# Patient Record
Sex: Female | Born: 1965 | Race: Asian | Hispanic: No | Marital: Married | Smoking: Never smoker
Health system: Southern US, Community
[De-identification: ages and names within clinical notes are randomized; demographics above are authoritative.]

## PROBLEM LIST (undated history)

## (undated) DIAGNOSIS — R011 Cardiac murmur, unspecified: Secondary | ICD-10-CM

## (undated) HISTORY — DX: Cardiac murmur, unspecified: R01.1

---

## 2006-06-02 HISTORY — PX: UMBILICAL HERNIA REPAIR: SHX196

## 2009-06-02 HISTORY — PX: ABDOMINAL HYSTERECTOMY: SHX81

## 2020-03-23 ENCOUNTER — Ambulatory Visit: Payer: Self-pay | Admitting: Family Medicine

## 2020-04-03 ENCOUNTER — Ambulatory Visit (INDEPENDENT_AMBULATORY_CARE_PROVIDER_SITE_OTHER): Payer: PRIVATE HEALTH INSURANCE | Admitting: Family Medicine

## 2020-04-03 ENCOUNTER — Other Ambulatory Visit: Payer: Self-pay

## 2020-04-03 ENCOUNTER — Encounter: Payer: Self-pay | Admitting: Family Medicine

## 2020-04-03 VITALS — BP 108/80 | HR 64 | Temp 97.7°F | Ht 62.0 in | Wt 172.1 lb

## 2020-04-03 DIAGNOSIS — R252 Cramp and spasm: Secondary | ICD-10-CM

## 2020-04-03 DIAGNOSIS — I10 Essential (primary) hypertension: Secondary | ICD-10-CM | POA: Insufficient documentation

## 2020-04-03 DIAGNOSIS — Z23 Encounter for immunization: Secondary | ICD-10-CM

## 2020-04-03 DIAGNOSIS — L659 Nonscarring hair loss, unspecified: Secondary | ICD-10-CM

## 2020-04-03 DIAGNOSIS — M545 Low back pain, unspecified: Secondary | ICD-10-CM

## 2020-04-03 DIAGNOSIS — M722 Plantar fascial fibromatosis: Secondary | ICD-10-CM

## 2020-04-03 NOTE — Patient Instructions (Signed)
Give [place] 2-3 business days to get the results of your labs back.   Keep the diet clean and stay active.  Because your blood pressure is well-controlled, you no longer have to check your blood pressure at home anymore unless you wish. Some people check it twice daily every day and some people stop altogether. Either or anything in between is fine. Strong work!  Ice/cold pack over area for 10-15 min twice daily.  Heat (pad or rice pillow in microwave) over affected area, 10-15 minutes twice daily.   Consider topical diclofenac for the hands.  Consider a Strassburg sock to wear at night.  Wear supportive shoes.   Plantar Fasciitis Stretches/exercises Do exercises exactly as told by your health care provider and adjust them as directed. It is normal to feel mild stretching, pulling, tightness, or discomfort as you do these exercises, but you should stop right away if you feel sudden pain or your pain gets worse.   Stretching and range of motion exercises These exercises warm up your muscles and joints and improve the movement and flexibility of your foot. These exercises also help to relieve pain.  Exercise A: Plantar fascia stretch 1. Sit with your left / right leg crossed over your opposite knee. 2. Hold your heel with one hand with that thumb near your arch. With your other hand, hold your toes and gently pull them back toward the top of your foot. You should feel a stretch on the bottom of your toes or your foot or both. 3. Hold this stretch for 30 seconds. 4. Slowly release your toes and return to the starting position. Repeat 2 times. Complete this exercise 3 times per week.  Exercise B: Gastroc, standing 1. Stand with your hands against a wall. 2. Extend your left / right leg behind you, and bend your front knee slightly. 3. Keeping your heels on the floor and keeping your back knee straight, shift your weight toward the wall without arching your back. You should feel a gentle  stretch in your left / right calf. 4. Hold this position for 30 seconds. Repeat 2 times. Complete this exercise 3 times a week. Exercise C: Soleus, standing 1. Stand with your hands against a wall. 2. Extend your left / right leg behind you, and bend your front knee slightly. 3. Keeping your heels on the floor, bend your back knee and slightly shift your weight over the back leg. You should feel a gentle stretch deep in your calf. 4. Hold this position for 30 seconds. Repeat 2 times. Complete this exercise 3 times per week. Exercise D: Gastrocsoleus, standing 1. Stand with the ball of your left / right foot on a step. The ball of your foot is on the walking surface, right under your toes. 2. Keep your other foot firmly on the same step. 3. Hold onto the wall or a railing for balance. 4. Slowly lift your other foot, allowing your body weight to press your heel down over the edge of the step. You should feel a stretch in your left / right calf. 5. Hold this position for 30 seconds. 6. Return both feet to the step. 7. Repeat this exercise with a slight bend in your left / right knee. Repeat 2 times with your left / right knee straight and 2times with your left / right knee bent. Complete this exercise 3 times a week.  Balance exercise This exercise builds your balance and strength control of your arch to help take  pressure off your plantar fascia. Exercise E: Single leg stand 1. Without shoes, stand near a railing or in a doorway. You may hold onto the railing or door frame as needed. 2. Stand on your left / right foot. Keep your big toe down on the floor and try to keep your arch lifted. Do not let your foot roll inward. 3. Hold this position for 30 seconds. 4. If this exercise is too easy, you can try it with your eyes closed or while standing on a pillow. Repeat 2 times. Complete this exercise 3 times per week. This information is not intended to replace advice given to you by your health  care provider. Make sure you discuss any questions you have with your health care provider. Document Released: 05/19/2005 Document Revised: 01/22/2016 Document Reviewed: 04/02/2015 [employer] Interactive Patient Education  2017 [employer]  EXERCISES  RANGE OF MOTION (ROM) AND STRETCHING EXERCISES - Low Back Pain Most people with lower back pain will find that their symptoms get worse with excessive bending forward (flexion) or arching at the lower back (extension). The exercises that will help resolve your symptoms will focus on the opposite motion.  If you have pain, numbness or tingling which travels down into your buttocks, leg or foot, the goal of the therapy is for these symptoms to move closer to your back and eventually resolve. Sometimes, these leg symptoms will get better, but your lower back pain may worsen. This is often an indication of progress in your rehabilitation. Be very alert to any changes in your symptoms and the activities in which you participated in the 24 hours prior to the change. Sharing this information with your caregiver will allow him or her to most efficiently treat your condition. These exercises may help you when beginning to rehabilitate your injury. Your symptoms may resolve with or without further involvement from your physician, physical therapist or athletic trainer. While completing these exercises, remember:   Restoring tissue flexibility helps normal motion to return to the joints. This allows healthier, less painful movement and activity.  An effective stretch should be held for at least 30 seconds.  A stretch should never be painful. You should only feel a gentle lengthening or release in the stretched tissue. FLEXION RANGE OF MOTION AND STRETCHING EXERCISES:  STRETCH - Flexion, Single Knee to Chest   Lie on a firm bed or floor with both legs extended in front of you.  Keeping one leg in contact with the floor, bring your opposite knee to your chest.  Hold your leg in place by either grabbing behind your thigh or at your knee.  Pull until you feel a gentle stretch in your low back. Hold 30 seconds.  Slowly release your grasp and repeat the exercise with the opposite side. Repeat 2 times. Complete this exercise 3 times per week.   STRETCH - Flexion, Double Knee to Chest  Lie on a firm bed or floor with both legs extended in front of you.  Keeping one leg in contact with the floor, bring your opposite knee to your chest.  Tense your stomach muscles to support your back and then lift your other knee to your chest. Hold your legs in place by either grabbing behind your thighs or at your knees.  Pull both knees toward your chest until you feel a gentle stretch in your low back. Hold 30 seconds.  Tense your stomach muscles and slowly return one leg at a time to the floor. Repeat 2  times. Complete this exercise 3 times per week.   STRETCH - Low Trunk Rotation  Lie on a firm bed or floor. Keeping your legs in front of you, bend your knees so they are both pointed toward the ceiling and your feet are flat on the floor.  Extend your arms out to the side. This will stabilize your upper body by keeping your shoulders in contact with the floor.  Gently and slowly drop both knees together to one side until you feel a gentle stretch in your low back. Hold for 30 seconds.  Tense your stomach muscles to support your lower back as you bring your knees back to the starting position. Repeat the exercise to the other side. Repeat 2 times. Complete this exercise at least 3 times per week.   EXTENSION RANGE OF MOTION AND FLEXIBILITY EXERCISES:  STRETCH - Extension, Prone on Elbows   Lie on your stomach on the floor, a bed will be too soft. Place your palms about shoulder width apart and at the height of your head.  Place your elbows under your shoulders. If this is too painful, stack pillows under your chest.  Allow your body to relax so that your  hips drop lower and make contact more completely with the floor.  Hold this position for 30 seconds.  Slowly return to lying flat on the floor. Repeat 2 times. Complete this exercise 3 times per week.   RANGE OF MOTION - Extension, Prone Press Ups  Lie on your stomach on the floor, a bed will be too soft. Place your palms about shoulder width apart and at the height of your head.  Keeping your back as relaxed as possible, slowly straighten your elbows while keeping your hips on the floor. You may adjust the placement of your hands to maximize your comfort. As you gain motion, your hands will come more underneath your shoulders.  Hold this position 30 seconds.  Slowly return to lying flat on the floor. Repeat 2 times. Complete this exercise 3 times per week.   RANGE OF MOTION- Quadruped, Neutral Spine   Assume a hands and knees position on a firm surface. Keep your hands under your shoulders and your knees under your hips. You may place padding under your knees for comfort.  Drop your head and point your tailbone toward the ground below you. This will round out your lower back like an angry cat. Hold this position for 30 seconds.  Slowly lift your head and release your tail bone so that your back sags into a large arch, like an old horse.  Hold this position for 30 seconds.  Repeat this until you feel limber in your low back.  Now, find your "sweet spot." This will be the most comfortable position somewhere between the two previous positions. This is your neutral spine. Once you have found this position, tense your stomach muscles to support your low back.  Hold this position for 30 seconds. Repeat 2 times. Complete this exercise 3 times per week.   STRENGTHENING EXERCISES - Low Back Sprain These exercises may help you when beginning to rehabilitate your injury. These exercises should be done near your "sweet spot." This is the neutral, low-back arch, somewhere between fully  rounded and fully arched, that is your least painful position. When performed in this safe range of motion, these exercises can be used for people who have either a flexion or extension based injury. These exercises may resolve your symptoms with or without  further involvement from your physician, physical therapist or athletic trainer. While completing these exercises, remember:   Muscles can gain both the endurance and the strength needed for everyday activities through controlled exercises.  Complete these exercises as instructed by your physician, physical therapist or athletic trainer. Increase the resistance and repetitions only as guided.  You may experience muscle soreness or fatigue, but the pain or discomfort you are trying to eliminate should never worsen during these exercises. If this pain does worsen, stop and make certain you are following the directions exactly. If the pain is still present after adjustments, discontinue the exercise until you can discuss the trouble with your caregiver.  STRENGTHENING - Deep Abdominals, Pelvic Tilt   Lie on a firm bed or floor. Keeping your legs in front of you, bend your knees so they are both pointed toward the ceiling and your feet are flat on the floor.  Tense your lower abdominal muscles to press your low back into the floor. This motion will rotate your pelvis so that your tail bone is scooping upwards rather than pointing at your feet or into the floor. With a gentle tension and even breathing, hold this position for 3 seconds. Repeat 2 times. Complete this exercise 3 times per week.   STRENGTHENING - Abdominals, Crunches   Lie on a firm bed or floor. Keeping your legs in front of you, bend your knees so they are both pointed toward the ceiling and your feet are flat on the floor. Cross your arms over your chest.  Slightly tip your chin down without bending your neck.  Tense your abdominals and slowly lift your trunk high enough to just  clear your shoulder blades. Lifting higher can put excessive stress on the lower back and does not further strengthen your abdominal muscles.  Control your return to the starting position. Repeat 2 times. Complete this exercise 3 times per week.   STRENGTHENING - Quadruped, Opposite UE/LE Lift   Assume a hands and knees position on a firm surface. Keep your hands under your shoulders and your knees under your hips. You may place padding under your knees for comfort.  Find your neutral spine and gently tense your abdominal muscles so that you can maintain this position. Your shoulders and hips should form a rectangle that is parallel with the floor and is not twisted.  Keeping your trunk steady, lift your right hand no higher than your shoulder and then your left leg no higher than your hip. Make sure you are not holding your breath. Hold this position for 30 seconds.  Continuing to keep your abdominal muscles tense and your back steady, slowly return to your starting position. Repeat with the opposite arm and leg. Repeat 2 times. Complete this exercise 3 times per week.   STRENGTHENING - Abdominals and Quadriceps, Straight Leg Raise   Lie on a firm bed or floor with both legs extended in front of you.  Keeping one leg in contact with the floor, bend the other knee so that your foot can rest flat on the floor.  Find your neutral spine, and tense your abdominal muscles to maintain your spinal position throughout the exercise.  Slowly lift your straight leg off the floor about 6 inches for a count of 3, making sure to not hold your breath.  Still keeping your neutral spine, slowly lower your leg all the way to the floor. Repeat this exercise with each leg 2 times. Complete this exercise 3 times per  week.  POSTURE AND BODY MECHANICS CONSIDERATIONS - Low Back Sprain Keeping correct posture when sitting, standing or completing your activities will reduce the stress put on different body  tissues, allowing injured tissues a chance to heal and limiting painful experiences. The following are general guidelines for improved posture.  While reading these guidelines, remember:  The exercises prescribed by your provider will help you have the flexibility and strength to maintain correct postures.  The correct posture provides the best environment for your joints to work. All of your joints have less wear and tear when properly supported by a spine with good posture. This means you will experience a healthier, less painful body.  Correct posture must be practiced with all of your activities, especially prolonged sitting and standing. Correct posture is as important when doing repetitive low-stress activities (typing) as it is when doing a single heavy-load activity (lifting).  RESTING POSITIONS Consider which positions are most painful for you when choosing a resting position. If you have pain with flexion-based activities (sitting, bending, stooping, squatting), choose a position that allows you to rest in a less flexed posture. You would want to avoid curling into a fetal position on your side. If your pain worsens with extension-based activities (prolonged standing, working overhead), avoid resting in an extended position such as sleeping on your stomach. Most people will find more comfort when they rest with their spine in a more neutral position, neither too rounded nor too arched. Lying on a non-sagging bed on your side with a pillow between your knees, or on your back with a pillow under your knees will often provide some relief. Keep in mind, being in any one position for a prolonged period of time, no matter how correct your posture, can still lead to stiffness.  PROPER SITTING POSTURE In order to minimize stress and discomfort on your spine, you must sit with correct posture. Sitting with good posture should be effortless for a healthy body. Returning to good posture is a gradual  process. Many people can work toward this most comfortably by using various supports until they have the flexibility and strength to maintain this posture on their own. When sitting with proper posture, your ears will fall over your shoulders and your shoulders will fall over your hips. You should use the back of the chair to support your upper back. Your lower back will be in a neutral position, just slightly arched. You may place a small pillow or folded towel at the base of your lower back for  support.  When working at a desk, create an environment that supports good, upright posture. Without extra support, muscles tire, which leads to excessive strain on joints and other tissues. Keep these recommendations in mind:  CHAIR:  A chair should be able to slide under your desk when your back makes contact with the back of the chair. This allows you to work closely.  The chair's height should allow your eyes to be level with the upper part of your monitor and your hands to be slightly lower than your elbows.  BODY POSITION  Your feet should make contact with the floor. If this is not possible, use a foot rest.  Keep your ears over your shoulders. This will reduce stress on your neck and low back.  INCORRECT SITTING POSTURES  If you are feeling tired and unable to assume a healthy sitting posture, do not slouch or slump. This puts excessive strain on your back tissues, causing more damage  and pain. Healthier options include:  Using more support, like a lumbar pillow.  Switching tasks to something that requires you to be upright or walking.  Talking a brief walk.  Lying down to rest in a neutral-spine position.  PROLONGED STANDING WHILE SLIGHTLY LEANING FORWARD  When completing a task that requires you to lean forward while standing in one place for a long time, place either foot up on a stationary 2-4 inch high object to help maintain the best posture. When both feet are on the ground, the  lower back tends to lose its slight inward curve. If this curve flattens (or becomes too large), then the back and your other joints will experience too much stress, tire more quickly, and can cause pain.  CORRECT STANDING POSTURES Proper standing posture should be assumed with all daily activities, even if they only take a few moments, like when brushing your teeth. As in sitting, your ears should fall over your shoulders and your shoulders should fall over your hips. You should keep a slight tension in your abdominal muscles to brace your spine. Your tailbone should point down to the ground, not behind your body, resulting in an over-extended swayback posture.   INCORRECT STANDING POSTURES  Common incorrect standing postures include a forward head, locked knees and/or an excessive swayback. WALKING Walk with an upright posture. Your ears, shoulders and hips should all line-up.  PROLONGED ACTIVITY IN A FLEXED POSITION When completing a task that requires you to bend forward at your waist or lean over a low surface, try to find a way to stabilize 3 out of 4 of your limbs. You can place a hand or elbow on your thigh or rest a knee on the surface you are reaching across. This will provide you more stability, so that your muscles do not tire as quickly. By keeping your knees relaxed, or slightly bent, you will also reduce stress across your lower back. CORRECT LIFTING TECHNIQUES  DO :  Assume a wide stance. This will provide you more stability and the opportunity to get as close as possible to the object which you are lifting.  Tense your abdominals to brace your spine. Bend at the knees and hips. Keeping your back locked in a neutral-spine position, lift using your leg muscles. Lift with your legs, keeping your back straight.  Test the weight of unknown objects before attempting to lift them.  Try to keep your elbows locked down at your sides in order get the best strength from your shoulders  when carrying an object.     Always ask for help when lifting heavy or awkward objects. INCORRECT LIFTING TECHNIQUES DO NOT:   Lock your knees when lifting, even if it is a small object.  Bend and twist. Pivot at your feet or move your feet when needing to change directions.  Assume that you can safely pick up even a paperclip without proper posture.

## 2020-04-03 NOTE — Addendum Note (Signed)
Addended by: Scharlene Gloss B on: 04/03/2020 03:38 PM   Modules accepted: Orders

## 2020-04-03 NOTE — Progress Notes (Signed)
Chief Complaint  Patient presents with  . New Patient (Initial Visit)       New Patient Visit SUBJECTIVE: HPI: Kelsey Baker is an 54 y.o.female who is being seen for establishing care.  The patient was previously seen at [facility].  Hypertension Patient presents for hypertension follow up. She does monitor home blood pressures. Blood pressures ranging on average from 110's/70's. She is compliant with medication-bisoprolol 5 mg daily. Patient has these side effects of medication: none She is usually  adhering to a healthy diet overall. Exercise: little due to below  Cramping over past year. RLE, mainly at night.  She drinks about 7-8 cups of water daily.  It would last for 10 to 15 minutes when it comes.  No prior work-up.  Patient has a roughly 4 year history of R plantar fasciitis.  She has tried getting supportive shoes and inserts providing arch support without relief.  She does not use a posterior splint or Strassburg sock.  She has never had any rehab for this.  Patient has midline low back pain approximately once per month but flares.  No bruising, swelling, or redness.  She denies any neurologic signs or symptoms.  She does not routinely stretch.  No neurologic signs or symptoms.  Over the past year, the patient has had worsening hair loss.  She does have stress.  No weight changes.  Past Medical History:  Diagnosis Date  . Heart murmur    Past Surgical History:  Procedure Laterality Date  . ABDOMINAL HYSTERECTOMY  2011  . UMBILICAL HERNIA REPAIR  2008   Family History  Problem Relation Age of Onset  . Hypertension Mother   . Heart attack Mother   . Heart disease Father   . Intellectual disability Father    No Known Allergies  Current Outpatient Medications:  .  aspirin EC 81 MG tablet, Take 81 mg by mouth daily. Swallow whole., Disp: , Rfl:  .  bisoprolol (ZEBETA) 5 MG tablet, Take 5 mg by mouth daily., Disp: , Rfl:   OBJECTIVE: BP 108/80 (BP Location: Left  Arm, Patient Position: Sitting, Cuff Size: Normal)   Pulse 64   Temp 97.7 F (36.5 C) (Oral)   Ht 5\' 2"  (1.575 m)   Wt 172 lb 2 oz (78.1 kg)   SpO2 98%   BMI 31.48 kg/m  General:  well developed, well nourished, in no apparent distress Skin: She has thinning in a female pattern baldness distribution Lungs:  clear to auscultation, breath sounds equal bilaterally, no respiratory distress Cardio:  regular rate and rhythm, no LE edema or bruits Musculoskeletal:  symmetrical muscle groups noted without atrophy or deformity; no current pain to palpation over the lumbar spine both midline and over the paraspinal musculature; there is tenderness to palpation of the right proximal insertion of the plantar fascia Neuro: DTRs equal and symmetric throughout, no clonus, no cerebellar signs Psych: well oriented with normal range of affect and appropriate judgment/insight  ASSESSMENT/PLAN: Essential hypertension  Need for influenza vaccination - Plan: Flu Vaccine QUAD 6+ mos PF IM (Fluarix Quad PF)  Leg cramping - Plan: Comprehensive metabolic panel, Magnesium  Plantar fasciitis  Hair loss - Plan: CBC, TSH, Iron, TIBC and Ferritin Panel  Midline low back pain, unspecified chronicity, without sciatica  1.  Counseled on diet exercise, continue bisoprolol 5 mg daily, does not need to monitor blood pressure at home. 2.  Flu shot today 3.  Check electrolytes, consider baclofen at night if labs are normal.  Could consider physical therapy. 4.  Strassburg sock, stretches and exercises, ice; injection if no improvement. 5.  Check above labs.  Could be related to stress.  Consider dermatology referral if no improvement. 6.  Stretches and exercises, heat. Patient should return in 1 month to recheck 3, 4, 5, and 6. The patient voiced understanding and agreement to the plan.   Jilda Roche Ingalls, DO 04/03/20  2:51 PM

## 2020-04-06 LAB — COMPREHENSIVE METABOLIC PANEL
AG Ratio: 1.7 (calc) (ref 1.0–2.5)
ALT: 23 U/L (ref 6–29)
AST: 25 U/L (ref 10–35)
Albumin: 4.3 g/dL (ref 3.6–5.1)
Alkaline phosphatase (APISO): 65 U/L (ref 37–153)
BUN: 13 mg/dL (ref 7–25)
CO2: 25 mmol/L (ref 20–32)
Calcium: 9.5 mg/dL (ref 8.6–10.4)
Chloride: 107 mmol/L (ref 98–110)
Creat: 0.68 mg/dL (ref 0.50–1.05)
Globulin: 2.6 g/dL (calc) (ref 1.9–3.7)
Glucose, Bld: 129 mg/dL — ABNORMAL HIGH (ref 65–99)
Potassium: 4.7 mmol/L (ref 3.5–5.3)
Sodium: 139 mmol/L (ref 135–146)
Total Bilirubin: 0.4 mg/dL (ref 0.2–1.2)
Total Protein: 6.9 g/dL (ref 6.1–8.1)

## 2020-04-06 LAB — TEST AUTHORIZATION

## 2020-04-06 LAB — CBC
HCT: 40.5 % (ref 35.0–45.0)
Hemoglobin: 13.5 g/dL (ref 11.7–15.5)
MCH: 28.2 pg (ref 27.0–33.0)
MCHC: 33.3 g/dL (ref 32.0–36.0)
MCV: 84.6 fL (ref 80.0–100.0)
MPV: 12.9 fL — ABNORMAL HIGH (ref 7.5–12.5)
Platelets: 192 10*3/uL (ref 140–400)
RBC: 4.79 10*6/uL (ref 3.80–5.10)
RDW: 12.4 % (ref 11.0–15.0)
WBC: 7.1 10*3/uL (ref 3.8–10.8)

## 2020-04-06 LAB — IRON,TIBC AND FERRITIN PANEL
%SAT: 29 % (calc) (ref 16–45)
Ferritin: 31 ng/mL (ref 16–232)
Iron: 93 ug/dL (ref 45–160)
TIBC: 320 mcg/dL (calc) (ref 250–450)

## 2020-04-06 LAB — MAGNESIUM: Magnesium: 2.3 mg/dL (ref 1.5–2.5)

## 2020-04-06 LAB — TSH: TSH: 0.53 mIU/L

## 2020-04-17 ENCOUNTER — Other Ambulatory Visit: Payer: Self-pay | Admitting: Family Medicine

## 2020-04-17 MED ORDER — BISOPROLOL FUMARATE 5 MG PO TABS
5.0000 mg | ORAL_TABLET | Freq: Every day | ORAL | 0 refills | Status: DC
Start: 2020-04-17 — End: 2020-07-09

## 2020-05-03 ENCOUNTER — Ambulatory Visit: Payer: PRIVATE HEALTH INSURANCE | Admitting: Family Medicine

## 2020-05-29 ENCOUNTER — Telehealth: Payer: Self-pay | Admitting: Family Medicine

## 2020-05-29 ENCOUNTER — Ambulatory Visit: Payer: PRIVATE HEALTH INSURANCE | Admitting: Family Medicine

## 2020-05-29 NOTE — Telephone Encounter (Signed)
The patient has been vaccinated. She would like to know when to get another one

## 2020-05-29 NOTE — Telephone Encounter (Signed)
I would wait a few weeks after she improves. OK to set up with infusion team if she is unvaccinated. Monitor BP at home. We can follow up next week if she is still having issues. Ty.

## 2020-05-29 NOTE — Telephone Encounter (Signed)
Patient called stating that she is COVID 19 positive and she  Would like to know what the next steps are, patient would like to know what should she be taking to help with symptoms.  Please Advise

## 2020-05-29 NOTE — Telephone Encounter (Signed)
Called the patient informed of PCP instructions. Her BP is fluctuating//at night 151/91. She is taking her BP medication---this am BP 104/73. She has had the [employer] vaccine on 08/2019. She would like to know when it is safe to get another shot. Also ok to call infusion with her info.

## 2020-05-29 NOTE — Telephone Encounter (Signed)
Supportive care at this point. If she isn't vaccinated, could schedule an appt to determine possibility of getting infusion. Ty.

## 2020-07-04 ENCOUNTER — Encounter: Payer: Self-pay | Admitting: Family Medicine

## 2020-07-09 ENCOUNTER — Other Ambulatory Visit: Payer: Self-pay | Admitting: Family Medicine

## 2020-07-24 ENCOUNTER — Other Ambulatory Visit: Payer: Self-pay

## 2020-07-24 ENCOUNTER — Encounter: Payer: Self-pay | Admitting: Family Medicine

## 2020-07-24 ENCOUNTER — Ambulatory Visit (INDEPENDENT_AMBULATORY_CARE_PROVIDER_SITE_OTHER): Payer: PRIVATE HEALTH INSURANCE | Admitting: Family Medicine

## 2020-07-24 ENCOUNTER — Ambulatory Visit (HOSPITAL_BASED_OUTPATIENT_CLINIC_OR_DEPARTMENT_OTHER)
Admission: RE | Admit: 2020-07-24 | Discharge: 2020-07-24 | Disposition: A | Discharge status: Home / Self Care | Payer: Managed Care, Other (non HMO) | Source: Ambulatory Visit | Attending: Family Medicine | Admitting: Family Medicine

## 2020-07-24 VITALS — BP 118/80 | HR 63 | Temp 98.1°F | Ht 62.0 in | Wt 174.5 lb

## 2020-07-24 DIAGNOSIS — Z1231 Encounter for screening mammogram for malignant neoplasm of breast: Secondary | ICD-10-CM

## 2020-07-24 DIAGNOSIS — Z111 Encounter for screening for respiratory tuberculosis: Secondary | ICD-10-CM | POA: Diagnosis not present

## 2020-07-24 DIAGNOSIS — Z0289 Encounter for other administrative examinations: Secondary | ICD-10-CM | POA: Diagnosis not present

## 2020-07-24 DIAGNOSIS — Z1159 Encounter for screening for other viral diseases: Secondary | ICD-10-CM

## 2020-07-24 DIAGNOSIS — Z114 Encounter for screening for human immunodeficiency virus [HIV]: Secondary | ICD-10-CM | POA: Diagnosis not present

## 2020-07-24 NOTE — Progress Notes (Signed)
Chief Complaint  Patient presents with  . Form Completion    Subjective: Patient is a 55 y.o. female here for completion of form.  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 She has no chronic pain issues and denies SOB or CP. Hearing and vision are fine. She has a hx of receiving the BCG vaccine in [place]. Has never had Quant gold test though.   There are some health maintenance items to catch up on.   Past Medical History:  Diagnosis Date  . Heart murmur     Objective: BP 118/80 (BP Location: Left Arm, Patient Position: Sitting, Cuff Size: Normal)   Pulse 63   Temp 98.1 F (36.7 C) (Oral)   Ht 5\' 2"  (1.575 m)   Wt 174 lb 8 oz (79.2 kg)   SpO2 98%   BMI 31.92 kg/m  General: Awake, appears stated age Neuro: 5/5 strength throughout. Gait nml.  Heart: RRR, no LE edema Lungs: CTAB, no rales, wheezes or rhonchi. No accessory muscle use Psych: Age appropriate judgment and insight, normal affect and mood  Assessment and Plan: Encounter for completion of form with patient  Screening-pulmonary TB - Plan: QuantiFERON-TB Gold Plus  Encounter for screening mammogram for malignant neoplasm of breast - Plan: MM DIGITAL SCREENING BILATERAL  Screening for HIV (human immunodeficiency virus) - Plan: HIV Antibody (routine testing w rflx)  Encounter for hepatitis C screening test for low risk patient - Plan: Hepatitis C antibody  Quant gold as she is unable to reliably do the TST. Assuming neg results, no issue with completing form with no restrictions.  The patient voiced understanding and agreement to the plan.  [place], DO 07/24/20  11:09 AM

## 2020-07-24 NOTE — Patient Instructions (Signed)
Give us 2-3 business days to get the results of your labs back.   Keep the diet clean and stay active.  Let us know if you need anything. 

## 2020-07-24 NOTE — Addendum Note (Signed)
Addended by: Mervin Kung A on: 07/24/2020 12:08 PM   Modules accepted: Orders

## 2020-07-25 LAB — HEPATITIS C ANTIBODY
Hepatitis C Ab: NONREACTIVE
SIGNAL TO CUT-OFF: 0.04 (ref <1.00)

## 2020-07-25 LAB — HIV ANTIBODY (ROUTINE TESTING W REFLEX): HIV 1&2 Ab, 4th Generation: NONREACTIVE

## 2020-07-26 LAB — HEPATITIS C ANTIBODY

## 2020-07-26 LAB — QUANTIFERON-TB GOLD PLUS
Mitogen-NIL: >10 IU/mL
NIL: 0.05 IU/mL
QuantiFERON-TB Gold Plus: NEGATIVE
TB1-NIL: 0 IU/mL
TB2-NIL: 0.02 IU/mL

## 2020-07-26 LAB — HIV ANTIBODY (ROUTINE TESTING W REFLEX)

## 2020-07-27 ENCOUNTER — Ambulatory Visit: Payer: PRIVATE HEALTH INSURANCE | Admitting: Family Medicine

## 2020-07-27 DIAGNOSIS — Z0289 Encounter for other administrative examinations: Secondary | ICD-10-CM

## 2020-09-17 ENCOUNTER — Other Ambulatory Visit: Payer: Self-pay | Admitting: Family Medicine

## 2020-09-17 MED ORDER — BISOPROLOL FUMARATE 5 MG PO TABS: 5.0000 mg | ORAL_TABLET | Freq: Every day | ORAL | 0 refills | Status: DC

## 2020-10-08 ENCOUNTER — Other Ambulatory Visit: Payer: Self-pay | Admitting: Family Medicine

## 2020-10-10 ENCOUNTER — Telehealth: Payer: Self-pay

## 2020-10-10 NOTE — Telephone Encounter (Signed)
Caller states she had an appointment back in February and was charged for not attending it, caller states she cancelled it and went to an appointment on the 22nd instead but was initially scheduled the 25th. Caller isn't sure why she was charged $50.  Telephone: [phone]

## 2020-10-11 NOTE — Telephone Encounter (Signed)
Patient calling back in regards no show

## 2020-10-15 NOTE — Telephone Encounter (Signed)
I spoke with the patient and advised her I sent an email to [employer] team asking them to remove the no show fee for DOS 07/24/2020.

## 2020-12-15 ENCOUNTER — Other Ambulatory Visit: Payer: Self-pay | Admitting: Family Medicine

## 2022-03-04 IMAGING — MG DIGITAL SCREENING BILAT W/ CAD
4 series · 4 of 4 positions shown · non-contrast
Comparison: Previous exam(s).

CLINICAL DATA: Screening.

EXAM:
DIGITAL SCREENING BILATERAL MAMMOGRAM WITH CAD
TECHNIQUE: Bilateral screening digital craniocaudal and mediolateral oblique
mammograms were obtained. The images were evaluated with
computer-aided detection.

[R CC]
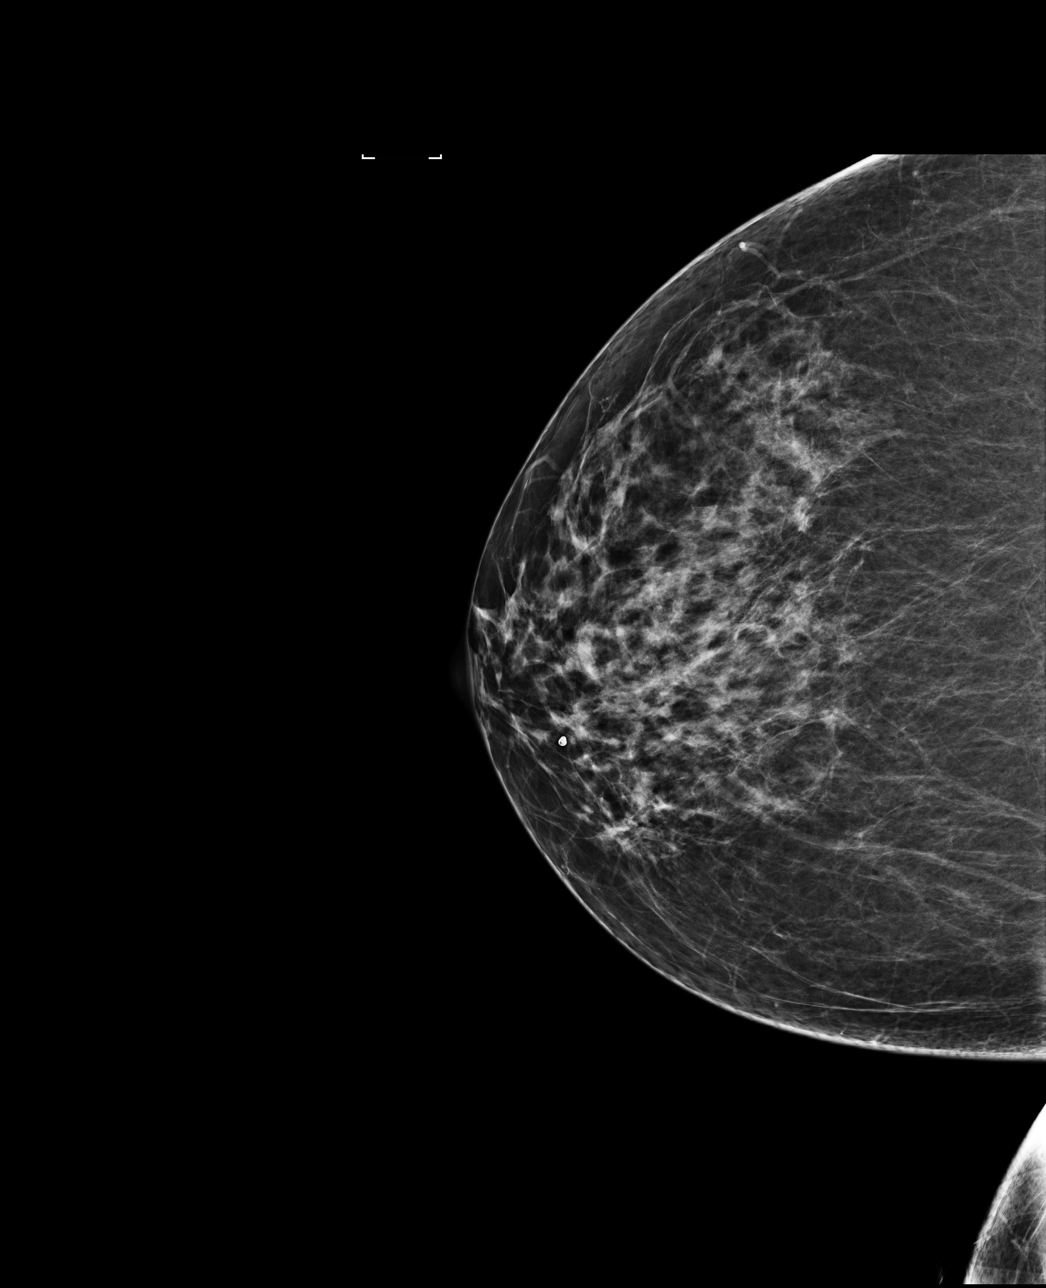

[R MLO]
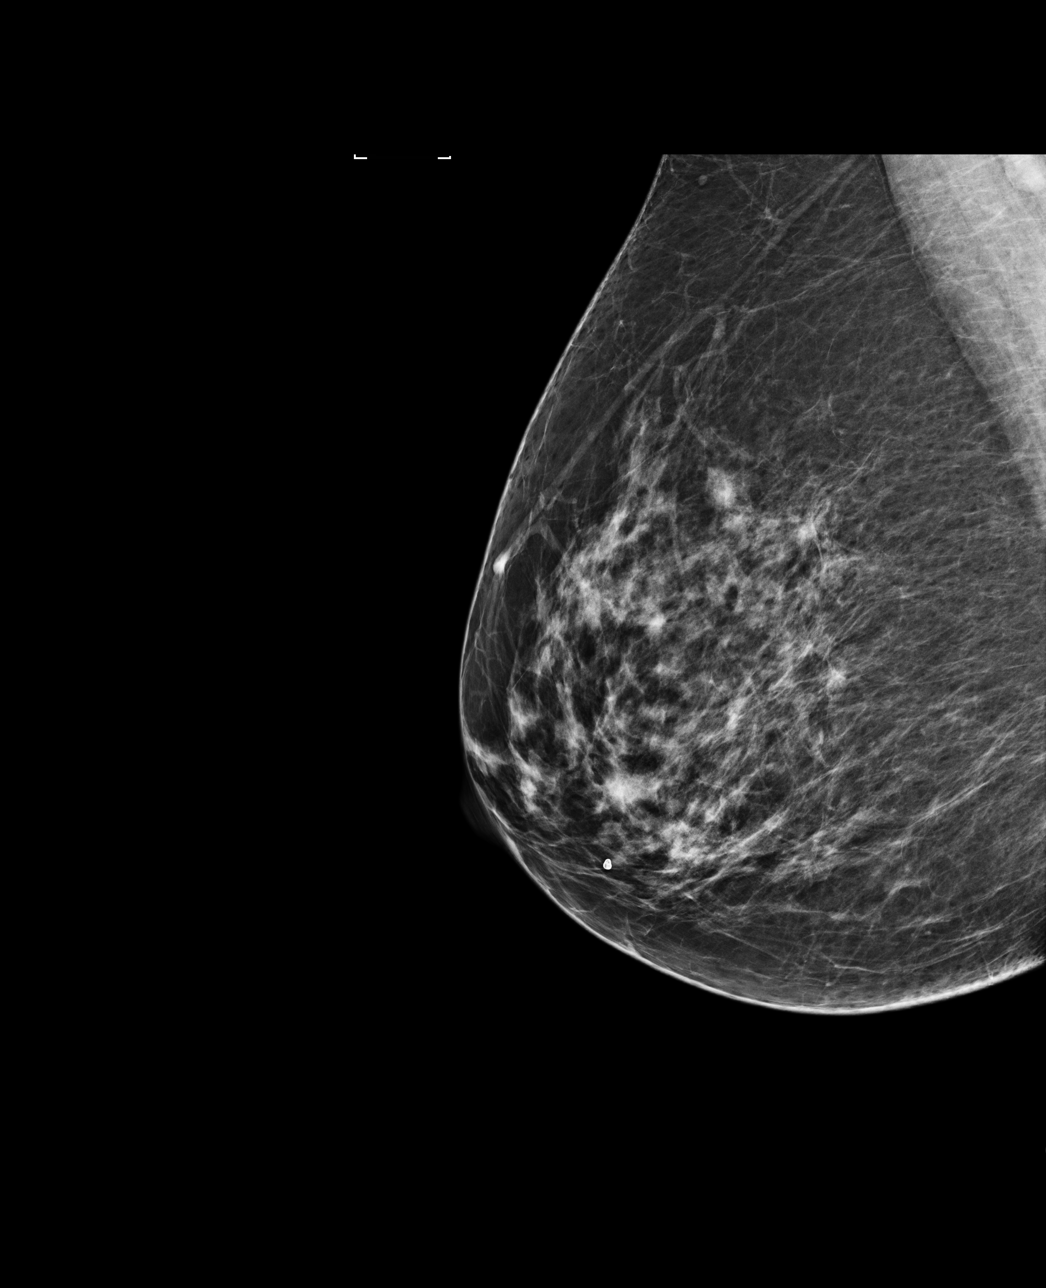

[L CC]
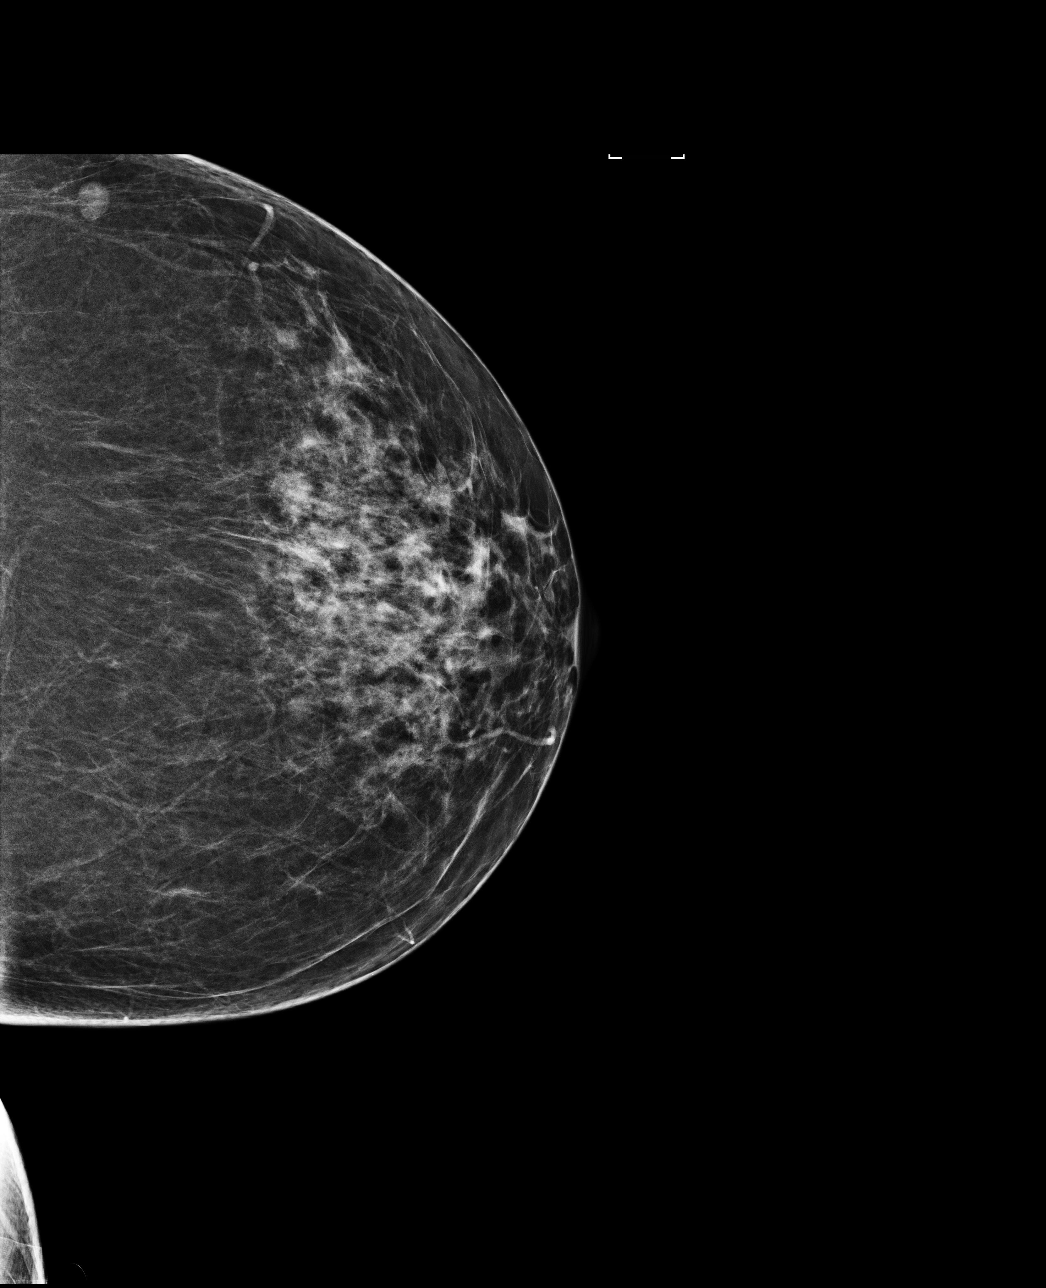

[L MLO]
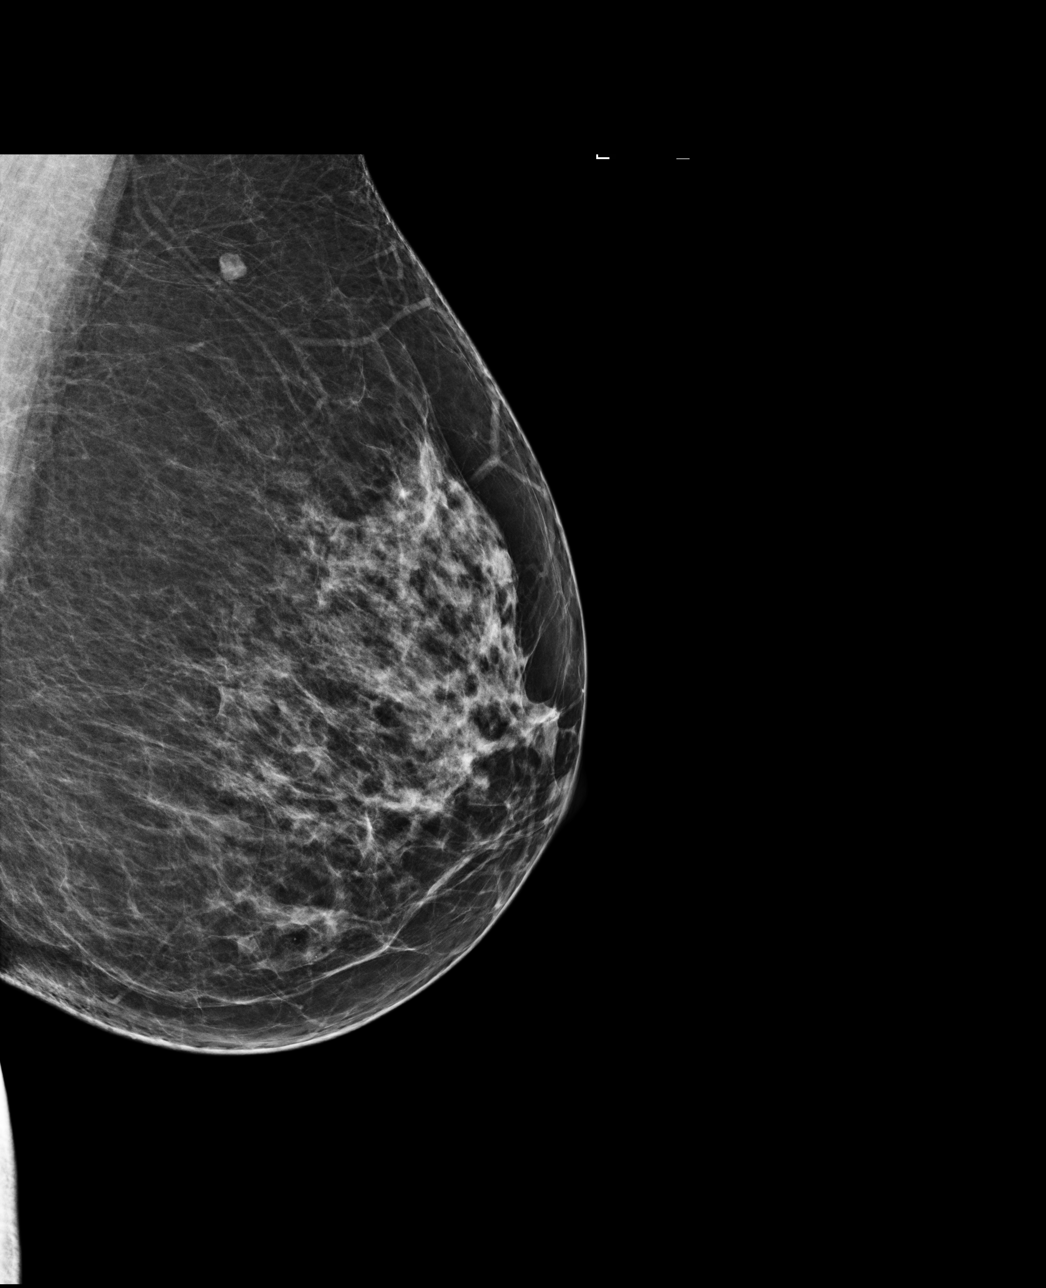

[4 of 4 positions shown; findings below may reference images not displayed]

ACR Breast Density Category c: The breast tissue is heterogeneously
dense, which may obscure small masses.
FINDINGS: There are no findings suspicious for malignancy. The images were
evaluated with computer-aided detection.
IMPRESSION: No mammographic evidence of malignancy. A result letter of this
screening mammogram will be mailed directly to the patient.

RECOMMENDATION:
Screening mammogram in one year. (Code:[id number])

BI-RADS CATEGORY  1: Negative.
